# Patient Record
Sex: Male | Born: 1995 | Race: Black or African American | Hispanic: No | Marital: Single | Smoking: Never smoker
Health system: Southern US, Community
[De-identification: ages and names within clinical notes are randomized; demographics above are authoritative.]

## PROBLEM LIST (undated history)

## (undated) HISTORY — PX: HAND SURGERY: SHX662

## (undated) HISTORY — PX: NO PAST SURGERIES: SHX2092

---
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
## 2018-08-08 ENCOUNTER — Encounter: Payer: Self-pay | Admitting: Emergency Medicine

## 2018-08-08 ENCOUNTER — Ambulatory Visit
Admission: EM | Admit: 2018-08-08 | Discharge: 2018-08-08 | Disposition: A | Discharge status: Home / Self Care | Payer: Self-pay | Attending: Family Medicine | Admitting: Family Medicine

## 2018-08-08 ENCOUNTER — Other Ambulatory Visit: Payer: Self-pay

## 2018-08-08 DIAGNOSIS — Z202 Contact with and (suspected) exposure to infections with a predominantly sexual mode of transmission: Secondary | ICD-10-CM

## 2018-08-08 LAB — CHLAMYDIA/NGC RT PCR (ARMC ONLY)
Chlamydia Tr: NOT DETECTED
N gonorrhoeae: NOT DETECTED

## 2018-08-08 NOTE — ED Provider Notes (Signed)
[facility]    CSN: [id number] Arrival date & time: 08/08/18  0945     History   Chief Complaint Chief Complaint  Patient presents with  . Exposure to STD    HPI Zachary Hickman is a 23 y.o. male.   23 yo male with a c/o "possible exposure to HSV 1". Patient denies any symptoms and states he always uses condoms, however he thinks he may have been exposed 2-3 weeks ago. When asked why he thinks so, he states he's not sure. He denies any rash, penile discharge, dysuria, pain, swelling or any other symptoms. States he would like to be tests for gonorrhea, chlamydia, HIV and syphilis.   The history is provided by the patient.  Exposure to STD     History reviewed. No pertinent past medical history.  There are no active problems to display for this patient.   Past Surgical History:  Procedure Laterality Date  . HAND SURGERY    . NO PAST SURGERIES         Home Medications    Prior to Admission medications   Not on File    Family History Family History  Problem Relation Age of Onset  . Healthy Mother   . Healthy Father     Social History Social History   Tobacco Use  . Smoking status: Never Smoker  . Smokeless tobacco: Never Used  Substance Use Topics  . Alcohol use: No  . Drug use: No     Allergies   Patient has no known allergies.   Review of Systems Review of Systems   Physical Exam Triage Vital Signs ED Triage Vitals  Enc Vitals Group     BP 08/08/18 1003 138/73     Pulse Rate 08/08/18 1003 91     Resp 08/08/18 1003 18     Temp 08/08/18 1003 98.7 F (37.1 C)     Temp Source 08/08/18 1003 Oral     SpO2 08/08/18 1003 99 %     Weight 08/08/18 1000 170 lb (77.1 kg)     Height 08/08/18 1000 5\' 10"  (1.778 m)     Head Circumference --      Peak Flow --      Pain Score 08/08/18 1000 0     Pain Loc --      Pain Edu? --      Excl. in GC? --    No data found.  Updated Vital Signs BP 138/73 (BP Location: Left Arm)   Pulse 91    Temp 98.7 F (37.1 C) (Oral)   Resp 18   Ht 5\' 10"  (1.778 m)   Wt 77.1 kg   SpO2 99%   BMI 24.39 kg/m   Visual Acuity Right Eye Distance:   Left Eye Distance:   Bilateral Distance:    Right Eye Near:   Left Eye Near:    Bilateral Near:     Physical Exam Vitals signs and nursing note reviewed.  Constitutional:      General: He is not in acute distress.    Appearance: He is not toxic-appearing or diaphoretic.  Neurological:     Mental Status: He is alert.      UC Treatments / Results  Labs (all labs ordered are listed, but only abnormal results are displayed) Labs Reviewed  CHLAMYDIA/NGC RT PCR ([facility] ONLY)  HIV ANTIBODY (ROUTINE TESTING W REFLEX)  RPR    EKG None  Radiology No results found.  Procedures Procedures (including  critical care time)  Medications Ordered in UC Medications - No data to display  Initial Impression / Assessment and Plan / UC Course  I have reviewed the triage vital signs and the nursing notes.  Pertinent labs & imaging results that were available during my care of the patient were reviewed by me and considered in my medical decision making (see chart for details).      Final Clinical Impressions(s) / UC Diagnoses   Final diagnoses:  Possible exposure to STD    ED Prescriptions    None     1. diagnosis reviewed with patient 2. Check labs as per orders 3. Recommend continue safe sex practices 4. Follow-up prn  Controlled Substance Prescriptions [place] Controlled Substance Registry consulted? Not Applicable   Payton Mccallum, MD 08/08/18 1054

## 2018-08-08 NOTE — ED Triage Notes (Signed)
Pt presents to urgent care for possible HSV1 exposure. He thinks possible exposure was 2-3 weeks ago. No symptoms or lesions. He would like to be tested for all other STD's as well. Denies urinary symptoms.

## 2018-08-09 LAB — RPR: RPR Ser Ql: NONREACTIVE

## 2018-08-09 LAB — HIV ANTIBODY (ROUTINE TESTING W REFLEX): HIV Screen 4th Generation wRfx: NONREACTIVE

## 2019-04-17 ENCOUNTER — Other Ambulatory Visit: Payer: Self-pay

## 2019-04-17 ENCOUNTER — Ambulatory Visit
Admission: EM | Admit: 2019-04-17 | Discharge: 2019-04-17 | Disposition: A | Discharge status: Home / Self Care | Payer: Self-pay | Attending: Emergency Medicine | Admitting: Emergency Medicine

## 2019-04-17 DIAGNOSIS — Z113 Encounter for screening for infections with a predominantly sexual mode of transmission: Secondary | ICD-10-CM | POA: Insufficient documentation

## 2019-04-17 DIAGNOSIS — Z202 Contact with and (suspected) exposure to infections with a predominantly sexual mode of transmission: Secondary | ICD-10-CM | POA: Insufficient documentation

## 2019-04-17 NOTE — ED Triage Notes (Signed)
Pt reports he is curious about STD's and just wants to be checked. Denies symptoms. Last unprotected sex 3 weeks ago

## 2019-04-17 NOTE — ED Provider Notes (Signed)
[place]   Name: Zachary Hickman DOB: July 23, 1995 MRN: [id number] CSN: [id number] PCP: Patient, No Pcp Per  Arrival date and time:  04/17/19 0854  Chief Complaint:  Exposure to STD   NOTE: Prior to seeing the patient today, I have reviewed the triage nursing documentation and vital signs. Clinical staff has updated patient's PMH/PSHx, current medication list, and drug allergies/intolerances to ensure comprehensive history available to assist in medical decision making.   History:   HPI: Zachary Hickman is a 24 y.o. male who presents today with complaints of possible exposure to STI. Patient denies any urinary symptoms; no dysuria, frequency, or urgency. He has not appreciated any gross hematuria, nor has he noticed his urine being malodorous. Patient denies any associated nausea, vomiting, fever, or chills. He has not experienced any pain in his lower back or flank area, however notes pain and pressure in his suprapubic area. Patient advises that he does not have a past medical history that is significant for recurrent urinary tract infections, STIs, or urolithiasis. Patient engages in unprotected sexual activity; last unprotected activity was about 3 weeks ago. He denies pain in his penis, testicles, or scrotum; no swelling. No penile bleeding or discharge.   History reviewed. No pertinent past medical history.  Past Surgical History:  Procedure Laterality Date  . HAND SURGERY    . NO PAST SURGERIES      Family History  Problem Relation Age of Onset  . Healthy Mother   . Healthy Father     Social History   Tobacco Use  . Smoking status: Never Smoker  . Smokeless tobacco: Never Used  Substance Use Topics  . Alcohol use: Yes    Comment: occasional  . Drug use: No    There are no problems to display for this patient.   Home Medications:    No outpatient medications have been marked as taking for the 04/17/19 encounter [facility] Encounter).    Allergies:   Patient  has no known allergies.  Review of Systems (ROS): Review of Systems  Constitutional: Negative for chills and fever.  Respiratory: Negative for cough and shortness of breath.   Cardiovascular: Negative for chest pain and palpitations.  Gastrointestinal: Negative for abdominal pain, nausea and vomiting.  Genitourinary: Negative for discharge, dysuria, flank pain, frequency, genital sores, hematuria, penile pain, penile swelling, scrotal swelling, testicular pain and urgency.  Musculoskeletal: Negative for back pain, myalgias and neck pain.  Skin: Negative for color change, pallor and rash.  All other systems reviewed and are negative.    Vital Signs: Today's Vitals   04/17/19 0905  BP: (!) 157/71  Pulse: 73  Resp: 15  Temp: 98.4 F (36.9 C)  TempSrc: Oral  SpO2: 100%  Weight: 165 lb (74.8 kg)  Height: 5\' 10"  (1.778 m)  PainSc: 0-No pain    Physical Exam: Physical Exam  Constitutional: He is oriented to person, place, and time and well-developed, well-nourished, and in no distress.  HENT:  Head: Normocephalic and atraumatic.  Eyes: Pupils are equal, round, and reactive to light.  Cardiovascular: Normal rate, regular rhythm, normal heart sounds and intact distal pulses.  Pulmonary/Chest: Effort normal and breath sounds normal.  Abdominal: Soft. Bowel sounds are normal. He exhibits no distension. There is no abdominal tenderness.  Genitourinary:    Genitourinary Comments: Exam deferred; no symptoms.    Lymphadenopathy:    He has no cervical adenopathy.  Neurological: He is alert and oriented to person, place, and time. Gait normal.  Skin: Skin is warm and dry. No rash noted. He is not diaphoretic.  Psychiatric: Mood, memory, affect and judgment normal.  Nursing note and vitals reviewed.   Urgent Care Treatments / Results:   Orders Placed This Encounter  Procedures  . GC/Chlamydia Probe Amp    LABS: PLEASE NOTE: all labs that were ordered this encounter are listed,  however only abnormal results are displayed. Labs Reviewed  GC/CHLAMYDIA PROBE AMP    EKG: -None  RADIOLOGY: No results found.  PROCEDURES: Procedures  MEDICATIONS RECEIVED THIS VISIT: Medications - No data to display  PERTINENT CLINICAL COURSE NOTES/UPDATES:   Initial Impression / Assessment and Plan / Urgent Care Course:  Pertinent labs & imaging results that were available during my care of the patient were personally reviewed by me and considered in my medical decision making (see lab/imaging section of note for values and interpretations).  Zachary Hickman is a 24 y.o. male who presents to [facility] today with complaints of Exposure to STD   Patient is well appearing overall in clinic today. He does not appear to be in any acute distress. Presenting symptoms (see HPI) and exam as documented above. Questionable exposure to STI and patient "just wants to get checked". In the spirit of public health and disease control his request is felt to be reasonable. Urine sample collected for [facility] and sent to lab for testing. Patient aware that she will be contacted with further treatment directives should her testing result as positive.   Discussed follow up with primary care physician or the health department should he develop any significant symptoms. I have reviewed the follow up and strict return precautions for any new or worsening symptoms. Patient is aware of symptoms that would be deemed urgent/emergent, and would thus require further evaluation either here or in the emergency department. At the time of discharge, he verbalized understanding and consent with the discharge plan as it was reviewed with him. All questions were fielded by provider and/or clinic staff prior to patient discharge.    Final Clinical Impressions / Urgent Care Diagnoses:   Final diagnoses:  Possible exposure to STD  Screen for STD (sexually transmitted disease)    New Prescriptions:  [place] Controlled  Substance Registry consulted? Not Applicable  No orders of the defined types were placed in this encounter.   Recommended Follow up Care:  Patient encouraged to follow up with the following provider within the specified time frame, or sooner as dictated by the severity of his symptoms. As always, he was instructed that for any urgent/emergent care needs, he should seek care either here or in the emergency department for more immediate evaluation.  Follow-up Information    PCP In 1 week.   Why: General reassessment of symptoms if not improving        NOTE: This note was prepared using Scientist, clinical (histocompatibility and immunogenetics) along with smaller Lobbyist. Despite my best ability to proofread, there is the potential that transcriptional errors may still occur from this process, and are completely unintentional.     Verlee Monte, NP 04/18/19 [phone]

## 2019-04-17 NOTE — Discharge Instructions (Addendum)
It was very nice seeing you today in clinic. Thank you for entrusting me with your care.   STD testing has been sent today. Results generally take 24-72 hours. We will call you if anything is positive and you require treatment.   Make arrangements to follow up with your regular doctor  or the health department if you develop any symptoms. Please remember, our [facility] providers are "right here with you" when you need [place]. Again, it was my pleasure to take care of you today. Thank you for choosing our clinic. I hope that you start to feel better quickly.   Quentin Mulling, MSN, APRN, FNP-C, CEN Advanced Practice Provider [facility]

## 2019-04-19 LAB — GC/CHLAMYDIA PROBE AMP
Chlamydia trachomatis, NAA: NEGATIVE
Neisseria Gonorrhoeae by PCR: NEGATIVE

## 2020-08-09 ENCOUNTER — Ambulatory Visit: Payer: Self-pay

## 2021-07-23 ENCOUNTER — Ambulatory Visit
Admission: RE | Admit: 2021-07-23 | Discharge: 2021-07-23 | Disposition: A | Discharge status: Home / Self Care | Payer: BC Managed Care – PPO | Source: Ambulatory Visit | Attending: Emergency Medicine | Admitting: Emergency Medicine

## 2021-07-23 VITALS — BP 148/76 | HR 79 | Temp 98.2°F | Resp 18

## 2021-07-23 DIAGNOSIS — Z113 Encounter for screening for infections with a predominantly sexual mode of transmission: Secondary | ICD-10-CM | POA: Diagnosis present

## 2021-07-23 DIAGNOSIS — R369 Urethral discharge, unspecified: Secondary | ICD-10-CM | POA: Diagnosis present

## 2021-07-23 NOTE — ED Provider Notes (Signed)
?[facility] ? ? ? ?CSN: [id number] ?Arrival date & time: 07/23/21  1711 ? ? ?  ? ?History   ?Chief Complaint ?Chief Complaint  ?Patient presents with  ? Exposure to STD  ?  Entered by patient  ? ? ?HPI ?Zachary Hickman is a 26 y.o. male.  Patient presents with penile discharge when he urinated 2 days ago.  No penile discharge since then.  He denies fever, chills, rash, abdominal pain, dysuria, flank pain, testicular pain, or other symptoms.  He is sexually active and does not consistently use condoms.  He denies pertinent medical history. ? ?The history is provided by the patient and medical records.  ? ?History reviewed. No pertinent past medical history. ? ?There are no problems to display for this patient. ? ? ?Past Surgical History:  ?Procedure Laterality Date  ? HAND SURGERY    ? NO PAST SURGERIES    ? ? ? ? ? ?Home Medications   ? ?Prior to Admission medications   ?Not on File  ? ? ?Family History ?Family History  ?Problem Relation Age of Onset  ? Healthy Mother   ? Healthy Father   ? ? ?Social History ?Social History  ? ?Tobacco Use  ? Smoking status: Never  ? Smokeless tobacco: Never  ?Vaping Use  ? Vaping Use: Never used  ?Substance Use Topics  ? Alcohol use: Yes  ?  Comment: occasional  ? Drug use: No  ? ? ? ?Allergies   ?Patient has no known allergies. ? ? ?Review of Systems ?Review of Systems  ?Constitutional:  Negative for chills and fever.  ?Gastrointestinal:  Negative for abdominal pain, nausea and vomiting.  ?Genitourinary:  Positive for penile discharge. Negative for dysuria, flank pain, frequency, hematuria and testicular pain.  ?Skin:  Negative for color change and rash.  ?All other systems reviewed and are negative. ? ? ?Physical Exam ?Triage Vital Signs ?ED Triage Vitals [07/23/21 1730]  ?Enc Vitals Group  ?   BP (!) 148/76  ?   Pulse Rate 79  ?   Resp 18  ?   Temp 98.2 ?F (36.8 ?C)  ?   Temp src   ?   SpO2 99 %  ?   Weight   ?   Height   ?   Head Circumference   ?   Peak Flow   ?   Pain  Score   ?   Pain Loc   ?   Pain Edu?   ?   Excl. in [place]?   ? ?No data found. ? ?Updated Vital Signs ?BP (!) 148/76   Pulse 79   Temp 98.2 ?F (36.8 ?C)   Resp 18   SpO2 99%  ? ?Visual Acuity ?Right Eye Distance:   ?Left Eye Distance:   ?Bilateral Distance:   ? ?Right Eye Near:   ?Left Eye Near:    ?Bilateral Near:    ? ?Physical Exam ?Vitals and nursing note reviewed.  ?Constitutional:   ?   General: He is not in acute distress. ?   Appearance: Normal appearance. He is well-developed. He is not ill-appearing.  ?HENT:  ?   Mouth/Throat:  ?   Mouth: Mucous membranes are moist.  ?Cardiovascular:  ?   Rate and Rhythm: Normal rate and regular rhythm.  ?   Heart sounds: Normal heart sounds.  ?Pulmonary:  ?   Effort: Pulmonary effort is normal. No respiratory distress.  ?   Breath sounds: Normal breath sounds.  ?  Abdominal:  ?   Palpations: Abdomen is soft.  ?   Tenderness: There is no abdominal tenderness. There is no right CVA tenderness, left CVA tenderness, guarding or rebound.  ?Musculoskeletal:  ?   Cervical back: Neck supple.  ?Skin: ?   General: Skin is warm and dry.  ?Neurological:  ?   Mental Status: He is alert.  ?Psychiatric:     ?   Mood and Affect: Mood normal.     ?   Behavior: Behavior normal.  ? ? ? ?UC Treatments / Results  ?Labs ?(all labs ordered are listed, but only abnormal results are displayed) ?Labs Reviewed  ?CYTOLOGY, (ORAL, ANAL, URETHRAL) ANCILLARY ONLY  ? ? ?EKG ? ? ?Radiology ?No results found. ? ?Procedures ?Procedures (including critical care time) ? ?Medications Ordered in UC ?Medications - No data to display ? ?Initial Impression / Assessment and Plan / UC Course  ?I have reviewed the triage vital signs and the nursing notes. ? ?Pertinent labs & imaging results that were available during my care of the patient were reviewed by me and considered in my medical decision making (see chart for details). ? ?  ?Penile discharge, screening for STDs.  Patient had 1 episode of penile discharge 2  days ago but none since.  He is sexually active and does not consistently use condoms.  Patient obtained self swab for testing.  He declines testing for HIV or syphilis.  Discussed that we will call if test results are positive.  Discussed that he may require treatment at that time.  Discussed that sexual partner(s) may also require treatment.  Instructed patient to abstain from sexual activity for at least 7 days.  Instructed him to follow-up with his PCP as needed.  Patient agrees to plan of care.  ? ?Final Clinical Impressions(s) / UC Diagnoses  ? ?Final diagnoses:  ?Penile discharge  ?Screening for STD (sexually transmitted disease)  ? ? ? ?Discharge Instructions   ? ?  ?Your tests are pending.  If your test results are positive, we will call you.  You and your sexual partner(s) may require treatment at that time.  Do not have sexual activity for at least 7 days.   ? ? ? ? ? ? ?ED Prescriptions   ?None ?  ? ?PDMP not reviewed this encounter. ?  ?Sharion Balloon, NP ?07/23/21 1807 ? ?

## 2021-07-23 NOTE — ED Triage Notes (Signed)
Pt noticed discharge while urinating 3 days ago. He would like STD testing.  ?

## 2021-07-23 NOTE — Discharge Instructions (Addendum)
Your tests are pending.  If your test results are positive, we will call you.  You and your sexual partner(s) may require treatment at that time.  Do not have sexual activity for at least 7 days.     

## 2021-07-24 LAB — CYTOLOGY, (ORAL, ANAL, URETHRAL) ANCILLARY ONLY
Chlamydia: NEGATIVE
Comment: NEGATIVE
Comment: NEGATIVE
Comment: NORMAL
Neisseria Gonorrhea: NEGATIVE
Trichomonas: NEGATIVE

## 2021-08-26 ENCOUNTER — Ambulatory Visit
Admission: RE | Admit: 2021-08-26 | Discharge: 2021-08-26 | Disposition: A | Discharge status: Home / Self Care | Payer: BC Managed Care – PPO | Source: Ambulatory Visit | Attending: Student | Admitting: Student

## 2021-08-26 VITALS — BP 140/72 | HR 80 | Temp 98.4°F | Resp 18

## 2021-08-26 DIAGNOSIS — N342 Other urethritis: Secondary | ICD-10-CM | POA: Insufficient documentation

## 2021-08-26 LAB — URINALYSIS, ROUTINE W REFLEX MICROSCOPIC
Bilirubin Urine: NEGATIVE
Glucose, UA: NEGATIVE mg/dL
Hgb urine dipstick: NEGATIVE
Ketones, ur: NEGATIVE mg/dL
Leukocytes,Ua: NEGATIVE
Nitrite: NEGATIVE
Protein, ur: NEGATIVE mg/dL
Specific Gravity, Urine: 1.015 (ref 1.005–1.030)
pH: 7 (ref 5.0–8.0)

## 2021-08-26 MED ORDER — DOXYCYCLINE HYCLATE 100 MG PO CAPS: 100.0000 mg | ORAL_CAPSULE | Freq: Two times a day (BID) | ORAL | 0 refills | Status: AC

## 2021-08-26 NOTE — ED Provider Notes (Signed)
[facility]    CSN: [id number] Arrival date & time: 08/26/21  1550      History   Chief Complaint Chief Complaint  Patient presents with   Penile Discharge    HPI Zachary Hickman is a 26 y.o. male presenting with concern for STI. History noncontributory. Presents with 1-2 months of intermittent penile discharge. States this only occurs a few times a week, during defecation. It is thin and clear or white, looks like it's a small amount of semen. It is not associated with dysuria, hematuria, frequency, urgency, abd pain, flank pain. At his last visit with [place], STI panel was negative 07/23/21, and no treatment was administered. He denies any new partners since then, and states girlfriend is monogamous with him.   HPI  History reviewed. No pertinent past medical history.  There are no problems to display for this patient.   Past Surgical History:  Procedure Laterality Date   HAND SURGERY     NO PAST SURGERIES         Home Medications    Prior to Admission medications   Medication Sig Start Date End Date Taking? Authorizing Provider  doxycycline (VIBRAMYCIN) 100 MG capsule Take 1 capsule (100 mg total) by mouth 2 (two) times daily for 7 days. 08/26/21 09/02/21 Yes Rhys Martini, PA-C    Family History Family History  Problem Relation Age of Onset   Healthy Mother    Healthy Father     Social History Social History   Tobacco Use   Smoking status: Never   Smokeless tobacco: Never  Vaping Use   Vaping Use: Never used  Substance Use Topics   Alcohol use: Yes    Comment: occasional   Drug use: No     Allergies   Patient has no known allergies.   Review of Systems Review of Systems  Constitutional:  Negative for chills and fever.  HENT:  Negative for sore throat.   Eyes:  Negative for pain and redness.  Respiratory:  Negative for shortness of breath.   Cardiovascular:  Negative for chest pain.  Gastrointestinal:  Negative for abdominal pain,  diarrhea, nausea and vomiting.  Genitourinary:  Positive for penile discharge. Negative for decreased urine volume, difficulty urinating, dysuria, flank pain, frequency, genital sores, hematuria and urgency.  Musculoskeletal:  Negative for back pain.  Skin:  Negative for rash.  All other systems reviewed and are negative.   Physical Exam Triage Vital Signs ED Triage Vitals  Enc Vitals Group     BP      Pulse      Resp      Temp      Temp src      SpO2      Weight      Height      Head Circumference      Peak Flow      Pain Score      Pain Loc      Pain Edu?      Excl. in GC?    No data found.  Updated Vital Signs BP 140/72 (BP Location: Left Arm)   Pulse 80   Temp 98.4 F (36.9 C) (Oral)   Resp 18   SpO2 99%   Visual Acuity Right Eye Distance:   Left Eye Distance:   Bilateral Distance:    Right Eye Near:   Left Eye Near:    Bilateral Near:     Physical Exam Vitals reviewed.  Constitutional:  General: He is not in acute distress.    Appearance: Normal appearance. He is not ill-appearing.  HENT:     Head: Normocephalic and atraumatic.     Mouth/Throat:     Mouth: Mucous membranes are moist.     Comments: Moist mucous membranes Eyes:     Extraocular Movements: Extraocular movements intact.     Pupils: Pupils are equal, round, and reactive to light.  Cardiovascular:     Rate and Rhythm: Normal rate and regular rhythm.     Heart sounds: Normal heart sounds.  Pulmonary:     Effort: Pulmonary effort is normal.     Breath sounds: Normal breath sounds. No wheezing, rhonchi or rales.  Abdominal:     General: Bowel sounds are normal. There is no distension.     Palpations: Abdomen is soft. There is no mass.     Tenderness: There is no abdominal tenderness. There is no right CVA tenderness, left CVA tenderness, guarding or rebound.  Genitourinary:    Comments: declined Skin:    General: Skin is warm.     Capillary Refill: Capillary refill takes less  than 2 seconds.     Comments: Good skin turgor  Neurological:     General: No focal deficit present.     Mental Status: He is alert and oriented to person, place, and time.  Psychiatric:        Mood and Affect: Mood normal.        Behavior: Behavior normal.     UC Treatments / Results  Labs (all labs ordered are listed, but only abnormal results are displayed) Labs Reviewed  URINALYSIS, ROUTINE W REFLEX MICROSCOPIC  CYTOLOGY, (ORAL, ANAL, URETHRAL) ANCILLARY ONLY    EKG   Radiology No results found.  Procedures Procedures (including critical care time)  Medications Ordered in UC Medications - No data to display  Initial Impression / Assessment and Plan / UC Course  I have reviewed the triage vital signs and the nursing notes.  Pertinent labs & imaging results that were available during my care of the patient were reviewed by me and considered in my medical decision making (see chart for details).     This patient is a very pleasant 26 y.o. year old male presenting with penile discharge during defecation for about 1 month. Afebrile, nontachycardic, no reproducible abd pain or CVAT. There is no new flank pain or midline back pain.  Negative STI panel 07/23/21, no new partners since then. Will send self-swab for G/C, trich Safe sex precautions.  UA wnl, did not send culture.   He describes clear penile discharge with the appearance of semen that only occurs during defecation. Suspect this may actually be seminal discharge rather than a STI or infection. Reassured patient that this may be a normal physiologic reaction. We will treat with doxycycline to cover for urethritis. If symptoms persist, could consider urology f/u; information provided.   ED return precautions discussed. Patient verbalizes understanding and agreement.     Final Clinical Impressions(s) / UC Diagnoses   Final diagnoses:  Urethritis     Discharge Instructions      -Urethritis is a swelling  (inflammation) of the urethra. The urethra is the tube that drains urine from the bladder. It is important to get treatment for this condition early. Delayed treatment may lead to complications, such as an infection in the urinary tract (ureters, kidneys, and bladder). -We will treat with doxycycline, which is an antibiotic.  -Doxycycline twice daily for  7 days.  Make sure to wear sunscreen while spending time outside while on this medication as it can increase your chance of sunburn. You can take this medication with food if you have a sensitive stomach. -We'll call with any abnormal lab results in 2-3 days.  -If symptoms persist despite treatment, follow-up with urology. Information below. If symptoms worsen before then, follow-up with [place].     ED Prescriptions     Medication Sig Dispense Auth. Provider   doxycycline (VIBRAMYCIN) 100 MG capsule Take 1 capsule (100 mg total) by mouth 2 (two) times daily for 7 days. 14 capsule Rhys Martini, PA-C      PDMP not reviewed this encounter.   Rhys Martini, PA-C 08/26/21 1747

## 2021-08-26 NOTE — Discharge Instructions (Addendum)
-  Urethritis is a swelling (inflammation) of the urethra. The urethra is the tube that drains urine from the bladder. It is important to get treatment for this condition early. Delayed treatment may lead to complications, such as an infection in the urinary tract (ureters, kidneys, and bladder). -We will treat with doxycycline, which is an antibiotic.  -Doxycycline twice daily for 7 days.  Make sure to wear sunscreen while spending time outside while on this medication as it can increase your chance of sunburn. You can take this medication with food if you have a sensitive stomach. -We'll call with any abnormal lab results in 2-3 days.  -If symptoms persist despite treatment, follow-up with urology. Information below. If symptoms worsen before then, follow-up with [place].

## 2021-08-26 NOTE — ED Triage Notes (Signed)
Pt presents with penile discharge that occurs off and on x 3 weeks. Pt denies any dysuria or abdominal pain.

## 2021-08-27 LAB — CYTOLOGY, (ORAL, ANAL, URETHRAL) ANCILLARY ONLY
Chlamydia: NEGATIVE
Comment: NEGATIVE
Comment: NEGATIVE
Comment: NORMAL
Neisseria Gonorrhea: NEGATIVE
Trichomonas: NEGATIVE
